# Patient Record
Sex: Male | Born: 1980 | Race: White | Hispanic: No | Marital: Married | Smoking: Never smoker
Health system: Southern US, Community
[De-identification: ages and names within clinical notes are randomized; demographics above are authoritative.]

## PROBLEM LIST (undated history)

## (undated) DIAGNOSIS — I10 Essential (primary) hypertension: Secondary | ICD-10-CM

## (undated) HISTORY — PX: ORTHOPEDIC SURGERY: SHX850

## (undated) HISTORY — DX: Essential (primary) hypertension: I10

---

## 2000-09-23 ENCOUNTER — Encounter: Payer: Self-pay | Admitting: General Surgery

## 2000-09-23 ENCOUNTER — Inpatient Hospital Stay (HOSPITAL_COMMUNITY): Admission: AC | Admit: 2000-09-23 | Discharge: 2000-10-05 | Payer: Self-pay

## 2000-09-23 ENCOUNTER — Encounter: Payer: Self-pay | Admitting: Orthopedic Surgery

## 2000-09-24 ENCOUNTER — Encounter: Payer: Self-pay | Admitting: Surgery

## 2000-10-05 ENCOUNTER — Inpatient Hospital Stay (HOSPITAL_COMMUNITY)
Admission: RE | Admit: 2000-10-05 | Discharge: 2000-10-14 | Payer: Self-pay | Admitting: Physical Medicine & Rehabilitation

## 2000-11-03 ENCOUNTER — Emergency Department (HOSPITAL_COMMUNITY): Admission: EM | Admit: 2000-11-03 | Discharge: 2000-11-04 | Payer: Self-pay

## 2000-11-04 ENCOUNTER — Encounter: Payer: Self-pay | Admitting: Emergency Medicine

## 2000-11-25 ENCOUNTER — Encounter: Payer: Self-pay | Admitting: Orthopedic Surgery

## 2000-11-26 ENCOUNTER — Inpatient Hospital Stay (HOSPITAL_COMMUNITY): Admission: RE | Admit: 2000-11-26 | Discharge: 2000-11-27 | Payer: Self-pay | Admitting: Orthopedic Surgery

## 2000-12-29 ENCOUNTER — Inpatient Hospital Stay (HOSPITAL_COMMUNITY): Admission: RE | Admit: 2000-12-29 | Discharge: 2001-01-01 | Payer: Self-pay | Admitting: Orthopedic Surgery

## 2000-12-29 ENCOUNTER — Encounter: Payer: Self-pay | Admitting: Orthopedic Surgery

## 2001-02-03 ENCOUNTER — Encounter: Payer: Self-pay | Admitting: Orthopedic Surgery

## 2001-02-04 ENCOUNTER — Inpatient Hospital Stay (HOSPITAL_COMMUNITY): Admission: AD | Admit: 2001-02-04 | Discharge: 2001-02-07 | Payer: Self-pay | Admitting: Orthopedic Surgery

## 2001-04-23 ENCOUNTER — Encounter: Payer: Self-pay | Admitting: Orthopedic Surgery

## 2001-04-24 ENCOUNTER — Inpatient Hospital Stay (HOSPITAL_COMMUNITY): Admission: RE | Admit: 2001-04-24 | Discharge: 2001-04-26 | Payer: Self-pay | Admitting: Orthopedic Surgery

## 2001-10-06 ENCOUNTER — Encounter: Payer: Self-pay | Admitting: Orthopedic Surgery

## 2001-10-06 ENCOUNTER — Inpatient Hospital Stay (HOSPITAL_COMMUNITY): Admission: RE | Admit: 2001-10-06 | Discharge: 2001-10-09 | Payer: Self-pay | Admitting: Orthopedic Surgery

## 2002-01-30 ENCOUNTER — Encounter: Payer: Self-pay | Admitting: Emergency Medicine

## 2002-01-30 ENCOUNTER — Emergency Department (HOSPITAL_COMMUNITY): Admission: EM | Admit: 2002-01-30 | Discharge: 2002-01-30 | Payer: Self-pay | Admitting: Emergency Medicine

## 2002-12-22 ENCOUNTER — Ambulatory Visit (HOSPITAL_COMMUNITY): Admission: RE | Admit: 2002-12-22 | Discharge: 2002-12-23 | Payer: Self-pay | Admitting: Orthopedic Surgery

## 2003-11-20 ENCOUNTER — Emergency Department (HOSPITAL_COMMUNITY): Admission: EM | Admit: 2003-11-20 | Discharge: 2003-11-20 | Payer: Self-pay | Admitting: Emergency Medicine

## 2004-09-08 ENCOUNTER — Emergency Department (HOSPITAL_COMMUNITY): Admission: EM | Admit: 2004-09-08 | Discharge: 2004-09-08 | Payer: Self-pay | Admitting: *Deleted

## 2006-07-11 ENCOUNTER — Emergency Department: Payer: Self-pay | Admitting: Emergency Medicine

## 2007-07-24 ENCOUNTER — Emergency Department: Payer: Self-pay | Admitting: Emergency Medicine

## 2009-12-16 ENCOUNTER — Ambulatory Visit: Payer: Self-pay | Admitting: Internal Medicine

## 2009-12-17 ENCOUNTER — Emergency Department: Payer: Self-pay | Admitting: Emergency Medicine

## 2012-04-26 ENCOUNTER — Emergency Department: Payer: Self-pay | Admitting: Emergency Medicine

## 2012-04-26 ENCOUNTER — Other Ambulatory Visit: Payer: Self-pay | Admitting: Physician Assistant

## 2012-04-26 LAB — COMPREHENSIVE METABOLIC PANEL
Albumin: 4.4 g/dL (ref 3.4–5.0)
Alkaline Phosphatase: 91 U/L (ref 50–136)
Anion Gap: 3 — ABNORMAL LOW (ref 7–16)
BUN: 15 mg/dL (ref 7–18)
Bilirubin,Total: 1 mg/dL (ref 0.2–1.0)
Calcium, Total: 8.6 mg/dL (ref 8.5–10.1)
Chloride: 108 mmol/L — ABNORMAL HIGH (ref 98–107)
Co2: 27 mmol/L (ref 21–32)
Creatinine: 0.93 mg/dL (ref 0.60–1.30)
EGFR (African American): >60
EGFR (Non-African Amer.): >60
Glucose: 91 mg/dL (ref 65–99)
Osmolality: 276 (ref 275–301)
Potassium: 4.3 mmol/L (ref 3.5–5.1)
SGOT(AST): 27 U/L (ref 15–37)
SGPT (ALT): 48 U/L (ref 12–78)
Sodium: 138 mmol/L (ref 136–145)
Total Protein: 8.3 g/dL — ABNORMAL HIGH (ref 6.4–8.2)

## 2012-04-26 LAB — CBC
HCT: 46.2 % (ref 40.0–52.0)
HGB: 15.9 g/dL (ref 13.0–18.0)
MCH: 28.9 pg (ref 26.0–34.0)
MCHC: 34.3 g/dL (ref 32.0–36.0)
MCV: 84 fL (ref 80–100)
Platelet: 208 10*3/uL (ref 150–440)
RBC: 5.48 10*6/uL (ref 4.40–5.90)
RDW: 12.9 % (ref 11.5–14.5)
WBC: 12.3 10*3/uL — ABNORMAL HIGH (ref 3.8–10.6)

## 2012-04-26 LAB — TROPONIN I
Troponin-I: <0.02 ng/mL
Troponin-I: <0.02 ng/mL

## 2012-04-26 LAB — CK TOTAL AND CKMB (NOT AT ARMC): CK, Total: 86 U/L (ref 35–232)

## 2012-04-26 LAB — PRO B NATRIURETIC PEPTIDE: B-Type Natriuretic Peptide: 83 pg/mL (ref 0–125)

## 2018-01-24 ENCOUNTER — Ambulatory Visit (INDEPENDENT_AMBULATORY_CARE_PROVIDER_SITE_OTHER): Payer: 59 | Admitting: Family Medicine

## 2018-01-24 ENCOUNTER — Encounter: Payer: Self-pay | Admitting: Family Medicine

## 2018-01-24 VITALS — BP 118/76 | HR 66 | Temp 97.9°F | Resp 16 | Ht 69.5 in | Wt 255.0 lb

## 2018-01-24 DIAGNOSIS — E785 Hyperlipidemia, unspecified: Secondary | ICD-10-CM

## 2018-01-24 DIAGNOSIS — Z114 Encounter for screening for human immunodeficiency virus [HIV]: Secondary | ICD-10-CM

## 2018-01-24 DIAGNOSIS — Z0000 Encounter for general adult medical examination without abnormal findings: Secondary | ICD-10-CM

## 2018-01-24 DIAGNOSIS — Z23 Encounter for immunization: Secondary | ICD-10-CM

## 2018-01-24 DIAGNOSIS — Z842 Family history of other diseases of the genitourinary system: Secondary | ICD-10-CM

## 2018-01-24 DIAGNOSIS — E559 Vitamin D deficiency, unspecified: Secondary | ICD-10-CM

## 2018-01-24 LAB — LIPID PANEL
Cholesterol: 207 mg/dL — ABNORMAL HIGH (ref 0–200)
HDL: 57 mg/dL (ref >39.00)
LDL Cholesterol: 131 mg/dL — ABNORMAL HIGH (ref 0–99)
NonHDL: 150.22
Total CHOL/HDL Ratio: 4
Triglycerides: 97 mg/dL (ref 0.0–149.0)
VLDL: 19.4 mg/dL (ref 0.0–40.0)

## 2018-01-24 LAB — COMPREHENSIVE METABOLIC PANEL
ALT: 37 U/L (ref 0–53)
AST: 22 U/L (ref 0–37)
Albumin: 4.6 g/dL (ref 3.5–5.2)
Alkaline Phosphatase: 52 U/L (ref 39–117)
BUN: 12 mg/dL (ref 6–23)
CO2: 27 meq/L (ref 19–32)
Calcium: 9.3 mg/dL (ref 8.4–10.5)
Chloride: 103 meq/L (ref 96–112)
Creatinine, Ser: 0.85 mg/dL (ref 0.40–1.50)
GFR: 106.3 mL/min (ref >60.00)
Glucose, Bld: 90 mg/dL (ref 70–99)
Potassium: 3.9 meq/L (ref 3.5–5.1)
Sodium: 137 meq/L (ref 135–145)
Total Bilirubin: 0.9 mg/dL (ref 0.2–1.2)
Total Protein: 7.6 g/dL (ref 6.0–8.3)

## 2018-01-24 LAB — VITAMIN D 25 HYDROXY (VIT D DEFICIENCY, FRACTURES): VITD: 21.65 ng/mL — ABNORMAL LOW (ref 30.00–100.00)

## 2018-01-24 LAB — B12 AND FOLATE PANEL
Folate: 15.9 ng/mL
Vitamin B-12: 491 pg/mL (ref 211–911)

## 2018-01-24 LAB — CBC
HCT: 40 % (ref 39.0–52.0)
Hemoglobin: 14.2 g/dL (ref 13.0–17.0)
MCHC: 35.4 g/dL (ref 30.0–36.0)
MCV: 83.9 fl (ref 78.0–100.0)
Platelets: 252 K/uL (ref 150.0–400.0)
RBC: 4.77 Mil/uL (ref 4.22–5.81)
RDW: 13.1 % (ref 11.5–15.5)
WBC: 5.3 K/uL (ref 4.0–10.5)
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
## 2018-01-25 LAB — THYROID PANEL WITH TSH
Free Thyroxine Index: 2.1 (ref 1.4–3.8)
T3 Uptake: 35 % (ref 22–35)
T4, Total: 6.1 ug/dL (ref 4.9–10.5)
TSH: 1.23 m[IU]/L (ref 0.40–4.50)

## 2018-01-25 LAB — PSA, TOTAL AND FREE
PSA, % Free: 20 % — ABNORMAL LOW (ref >25)
PSA, Free: 0.1 ng/mL
PSA, Total: 0.5 ng/mL (ref <=4.0)

## 2018-01-25 LAB — HIV ANTIBODY (ROUTINE TESTING W REFLEX): HIV 1&2 Ab, 4th Generation: NONREACTIVE

## 2018-01-26 NOTE — Addendum Note (Signed)
Addended by: Leanora Cover on: 01/26/2018 02:41 PM   Modules accepted: Orders

## 2018-02-09 ENCOUNTER — Ambulatory Visit (INDEPENDENT_AMBULATORY_CARE_PROVIDER_SITE_OTHER): Payer: 59

## 2018-02-09 ENCOUNTER — Ambulatory Visit (INDEPENDENT_AMBULATORY_CARE_PROVIDER_SITE_OTHER): Payer: 59 | Admitting: Family Medicine

## 2018-02-09 ENCOUNTER — Encounter: Payer: Self-pay | Admitting: Family Medicine

## 2018-02-09 VITALS — BP 112/82 | HR 70 | Temp 98.0°F | Resp 16 | Ht 69.5 in | Wt 263.4 lb

## 2018-02-09 DIAGNOSIS — Z6838 Body mass index (BMI) 38.0-38.9, adult: Secondary | ICD-10-CM | POA: Diagnosis not present

## 2018-02-09 DIAGNOSIS — E785 Hyperlipidemia, unspecified: Secondary | ICD-10-CM | POA: Diagnosis not present

## 2018-02-09 DIAGNOSIS — M25562 Pain in left knee: Secondary | ICD-10-CM

## 2018-02-09 DIAGNOSIS — E66812 Obesity, class 2: Secondary | ICD-10-CM | POA: Insufficient documentation
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
## 2018-02-09 NOTE — Patient Instructions (Signed)
Take aleve 220 mg twice per day OR 400-600 mg of ibuprofen 3 times a day as needed for joint pain   This is  Dr. Melina Schoolsullo's  example of a  "Low GI"  Diet:  It will allow you to lose 4 to 8  lbs  per month if you follow it carefully.  Your goal with exercise is a minimum of 30 minutes of aerobic exercise 5 days per week (Walking does not count once it becomes easy!)     All of the foods can be found at grocery stores and in bulk at [employer].  The Atkins protein bars and shakes are available in more varieties at Target, WalMart and [employer]'s Foods.       7 AM Breakfast:  Choose from the following:             Low carbohydrate Protein  Shakes (I recommend the  Premier Protein chocolate shakes,  EAS AdvantEdge "Carb Control" shakes  Or the Atkins shakes all are under 3 net carbs)                           a scrambled egg/bacon/cheese burrito made with Mission's "carb balance" whole wheat tortilla  (about 10 net carbs )             Medical laboratory scientific officerJimmy Deans sells microwaveable frittata (basically a quiche without the pastry crust) that is eaten cold and very convenient way to get your eggs.  8 carbs)             If you make your own protein shakes, avoid bananas and pineapple,  And use low carb greek yogurt or original /unsweetened almond or soy milk     Avoid cereal and bananas, oatmeal and cream of wheat and grits. They are loaded with carbohydrates!     10 AM: high protein snack:             Protein bar by Atkins (the snack size, under 200 cal, usually < 6 net carbs).               A stick of cheese:  Around 1 carb,  100 cal                Dannon Light n Fit [place] Yogurt  (80 cal, 8 carbs)  Other so called "protein bars" and Greek yogurts tend to be loaded with carbohydrates.  Remember, in food advertising, the word "energy" is synonymous for " carbohydrate."   Lunch:              A Sandwich using the bread choices listed, Can use any  Eggs,  lunchmeat, grilled meat or canned tuna), avocado, regular mayo/mustard   and cheese.             A Salad using blue cheese, ranch,  Goddess or vinagrette,  Avoid taco shells, croutons or "confetti" and no "candied nuts" but regular nuts OK.              No pretzels, nabs  or chips.  Pickles and miniature sweet peppers are a good low carb alternative that provide a "crunch"  The bread is the only source of carbohydrate in a sandwich and  can be decreased by trying some of the attached alternatives to traditional loaf bread              Avoid "Low fat dressings, as well as ActorCatalina and [place] dressings  They are loaded with sugar!     3 PM/ Mid day  Snack:             Consider  1 ounce of  almonds, walnuts, pistachios, pecans, peanuts,  Macadamia nuts or a nut medley.  Avoid "granola and granola bars "  Mixed nuts are ok in moderation as long as there are no raisins,  cranberries or dried fruit.   KIND bars are OK if you get the low glycemic index variety   Try the prosciutto/mozzarella cheese sticks by Fiorruci  In deli /backery section   High protein                 6 PM  Dinner:                Meat/fowl/fish with a green salad, and either broccoli, cauliflower, green beans, spinach, brussel sprouts or  Lima beans. DO NOT BREAD THE PROTEIN!!               There is a low carb pasta by Dreamfield's that is acceptable and tastes great: only 5 digestible carbs/serving.( All grocery stores but BJs carry it ) Several ready made meals are available low carb:              Try Michel Angelo's chicken piccata or chicken or eggplant parm over low carb pasta.(Lowes and BJs)              Clifton Custard Sanchez's "Carnitas" (pulled pork, no sauce,  0 carbs) or his beef pot roast to make a dinner burrito (at [employer]J's)             Pesto over low carb pasta (bj's sells a good quality pesto in the center refrigerated section of the deli              Try satueeing  Roosvelt Harps with mushroooms as a good side              Green Giant makes a mashed cauliflower that tastes like mashed  potatoes   Whole wheat pasta is still full of digestible carbs and  Not as low in glycemic index as Dreamfield's.   Brown rice is still rice,  So skip the rice and noodles if you eat [place] or [place] (or at least limit to 1/2 cup)   9 PM snack :              Breyer's "low carb" fudgsicle or  ice cream bar (Carb Smart line), or  Weight Watcher's ice cream bar , or another "no sugar added" ice cream;             a serving of fresh berries/cherries with whipped cream              Cheese or DANNON'S LlGHT N FIT GREEK YOGURT             8 ounces of [place] unsweetened almond/cococunut milk               Treat yourself to a parfait made with whipped cream blueberiies, walnuts and vanilla greek yogurt   Avoid bananas, pineapple, grapes  and watermelon on a regular basis because they are high in sugar.  THINK OF THEM AS DESSERT   Remember that snack Substitutions should be less than 10 NET carbs per serving and meals < 20 carbs. Remember to subtract fiber grams to get the "net carbs."

## 2018-02-23 ENCOUNTER — Ambulatory Visit: Payer: 59 | Admitting: Family Medicine

## 2018-03-03 ENCOUNTER — Other Ambulatory Visit: Payer: Self-pay | Admitting: Family Medicine

## 2018-03-03 ENCOUNTER — Telehealth: Payer: Self-pay

## 2018-03-03 DIAGNOSIS — M25562 Pain in left knee: Secondary | ICD-10-CM

## 2018-03-03 MED ORDER — MELOXICAM 7.5 MG PO TABS: 7.5000 mg | ORAL_TABLET | Freq: Every day | ORAL | 1 refills | Status: DC | PRN

## 2018-03-03 NOTE — Telephone Encounter (Signed)
I can do a once daily mobic 7.5mg  for pain, no additional advil with this. Can take tylenol in addition if needed.   If he is still having pain and cannot afford physical therapy we should refer to sports medicine or orthopedics

## 2018-03-03 NOTE — Telephone Encounter (Signed)
Please find out pharmacy.   None in chart

## 2018-03-03 NOTE — Telephone Encounter (Signed)
[facility] on [address], in  [place]. I just added it.

## 2018-03-03 NOTE — Telephone Encounter (Signed)
Pt called [facility] Pt saw Lauren on 02/09/2018 for knee pain and he call today to say he still having the pain and wanted something called in for pain. I told him since it has been almost a month since he was seen for the pain that he needed an appt and I did schedule him for  03/04/2018 and he asked the [facility] Shames give him a call

## 2018-03-03 NOTE — Telephone Encounter (Signed)
Copied from CRM [id number]. Topic: General - Other >> Mar 03, 2018  8:35 AM Percival Spanish wrote:  Pt saw Lauren on 02/09/2018 for knee pain and he call today to say he still having the pain and wanted something called in for pain. I told him since it has been almost a month since he was seen for the pain that he needed an appt and I did schedule him for  03/04/2018 and he asked the Leotis Shames give him a call

## 2018-03-04 ENCOUNTER — Ambulatory Visit: Payer: 59 | Admitting: Family Medicine

## 2018-03-24 ENCOUNTER — Ambulatory Visit: Payer: 59 | Admitting: Family Medicine

## 2018-04-11 ENCOUNTER — Ambulatory Visit: Payer: 59 | Admitting: Family Medicine

## 2018-04-27 ENCOUNTER — Other Ambulatory Visit: Payer: 59

## 2018-04-29 ENCOUNTER — Ambulatory Visit: Payer: 59 | Admitting: Family Medicine

## 2018-04-29 ENCOUNTER — Ambulatory Visit (INDEPENDENT_AMBULATORY_CARE_PROVIDER_SITE_OTHER): Payer: 59 | Admitting: Family Medicine

## 2018-04-29 ENCOUNTER — Other Ambulatory Visit: Payer: Self-pay

## 2018-04-29 ENCOUNTER — Encounter: Payer: Self-pay | Admitting: Family Medicine

## 2018-04-29 DIAGNOSIS — E559 Vitamin D deficiency, unspecified: Secondary | ICD-10-CM

## 2018-04-29 DIAGNOSIS — M25562 Pain in left knee: Secondary | ICD-10-CM | POA: Diagnosis not present

## 2018-04-29 DIAGNOSIS — Z713 Dietary counseling and surveillance: Secondary | ICD-10-CM | POA: Diagnosis not present

## 2018-04-29 DIAGNOSIS — E785 Hyperlipidemia, unspecified: Secondary | ICD-10-CM | POA: Diagnosis not present

## 2018-04-29 DIAGNOSIS — G8929 Other chronic pain: Secondary | ICD-10-CM
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
## 2018-05-02 ENCOUNTER — Other Ambulatory Visit: Payer: Self-pay | Admitting: Family Medicine

## 2018-05-02 DIAGNOSIS — M25562 Pain in left knee: Secondary | ICD-10-CM

## 2018-05-03 ENCOUNTER — Other Ambulatory Visit: Payer: 59

## 2019-01-25 ENCOUNTER — Encounter: Payer: 59 | Admitting: Family Medicine

## 2019-07-26 IMAGING — DX DG KNEE COMPLETE 4+V*L*
5 series · 5 of 5 positions shown · non-contrast
Comparison: 11/20/2003

CLINICAL DATA: Left knee pain

EXAM:
LEFT KNEE - COMPLETE 4+ VIEW

[knee standing ap]
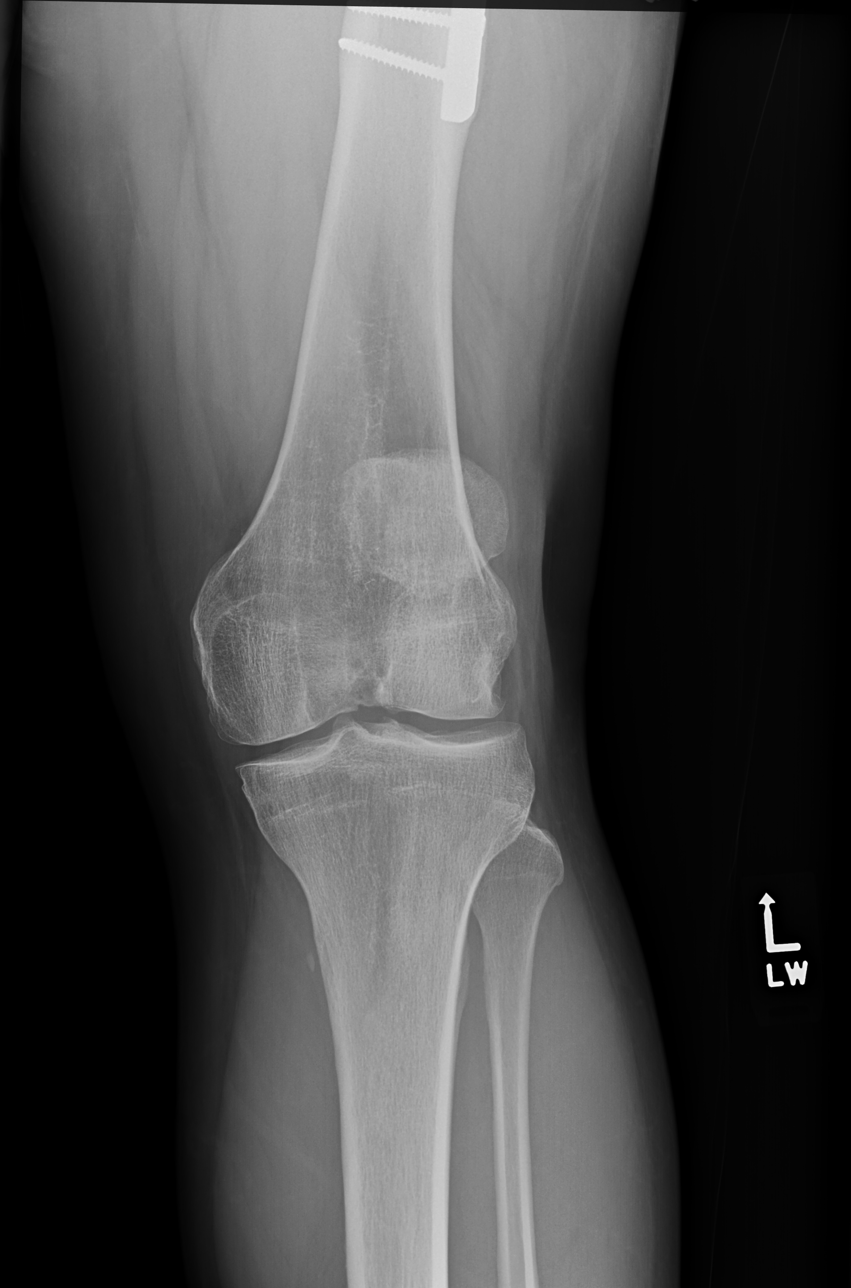

[knee standing external ap]
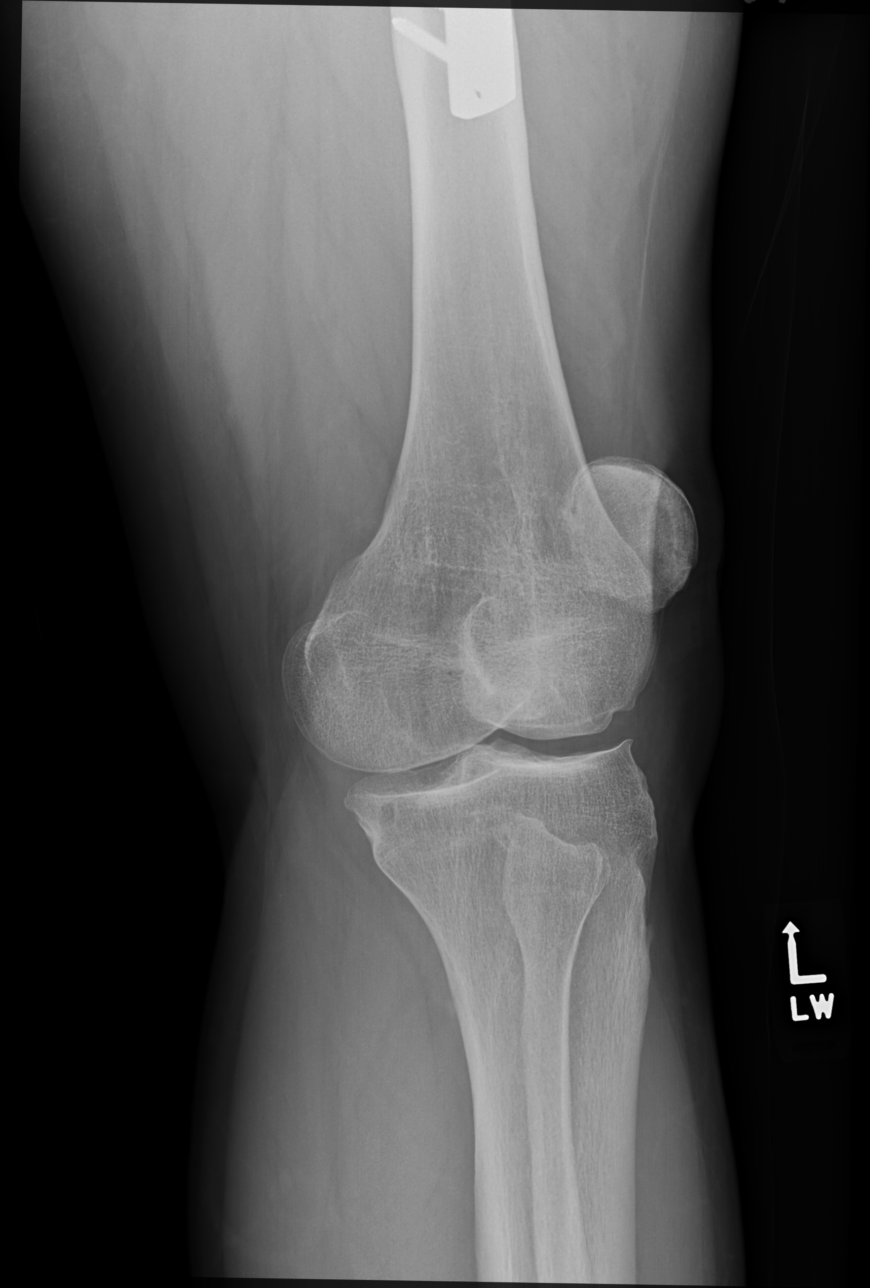

[knee standing internal ap]
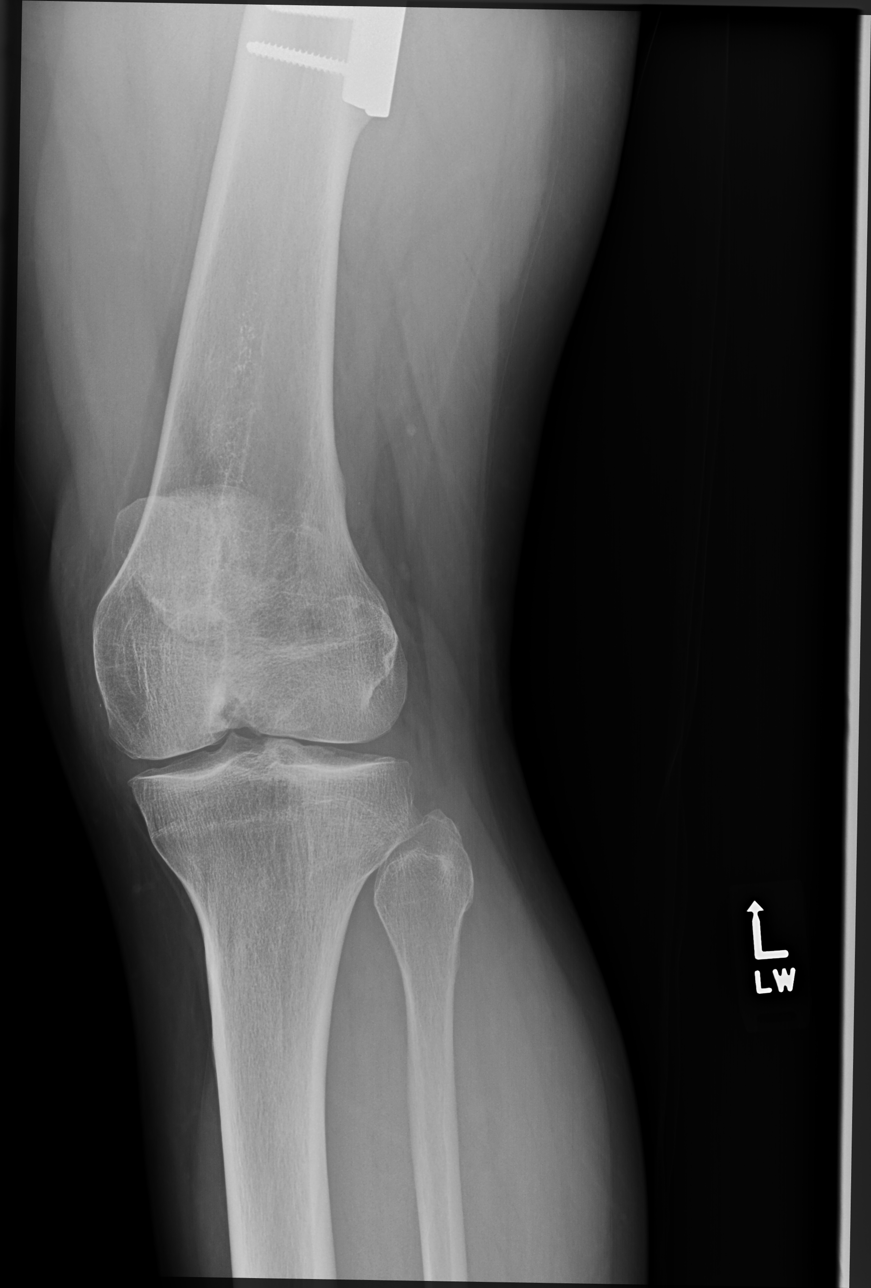

[knee standing lat]
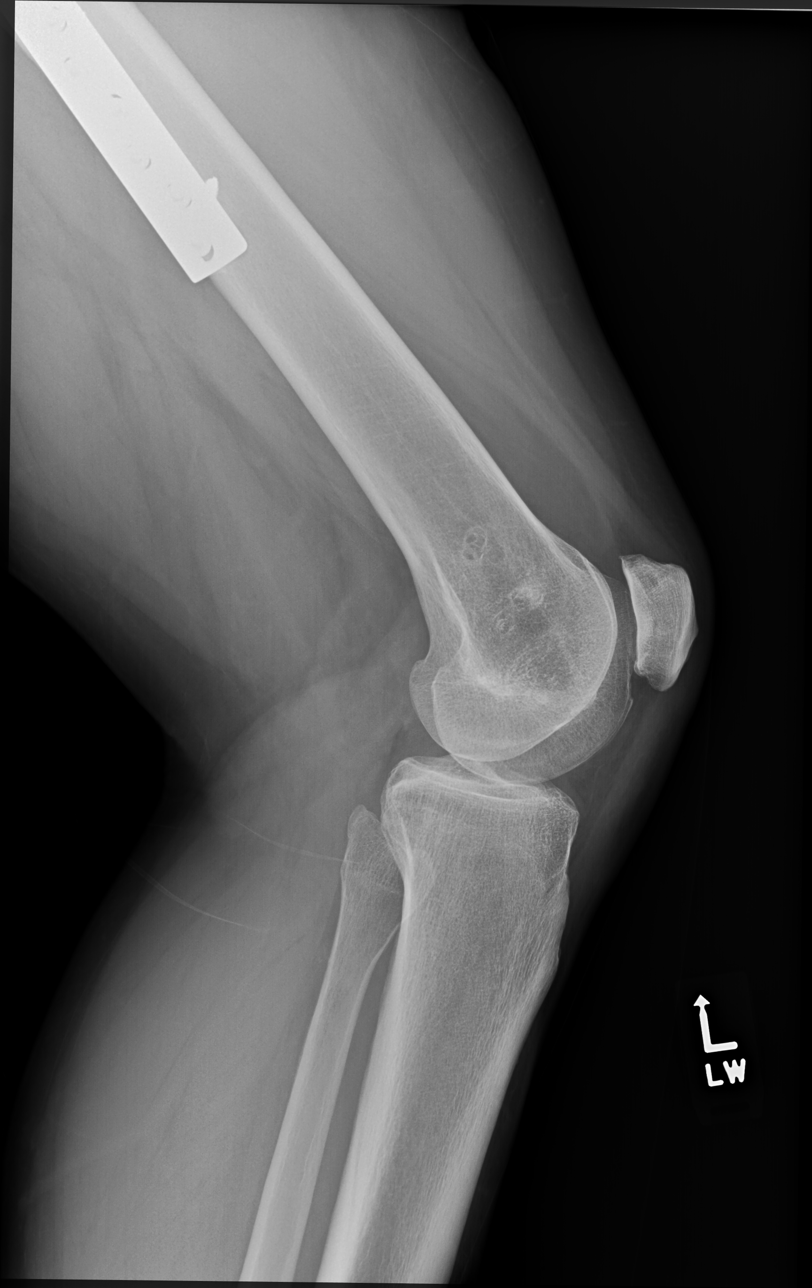

[knee [person_name] view pa]
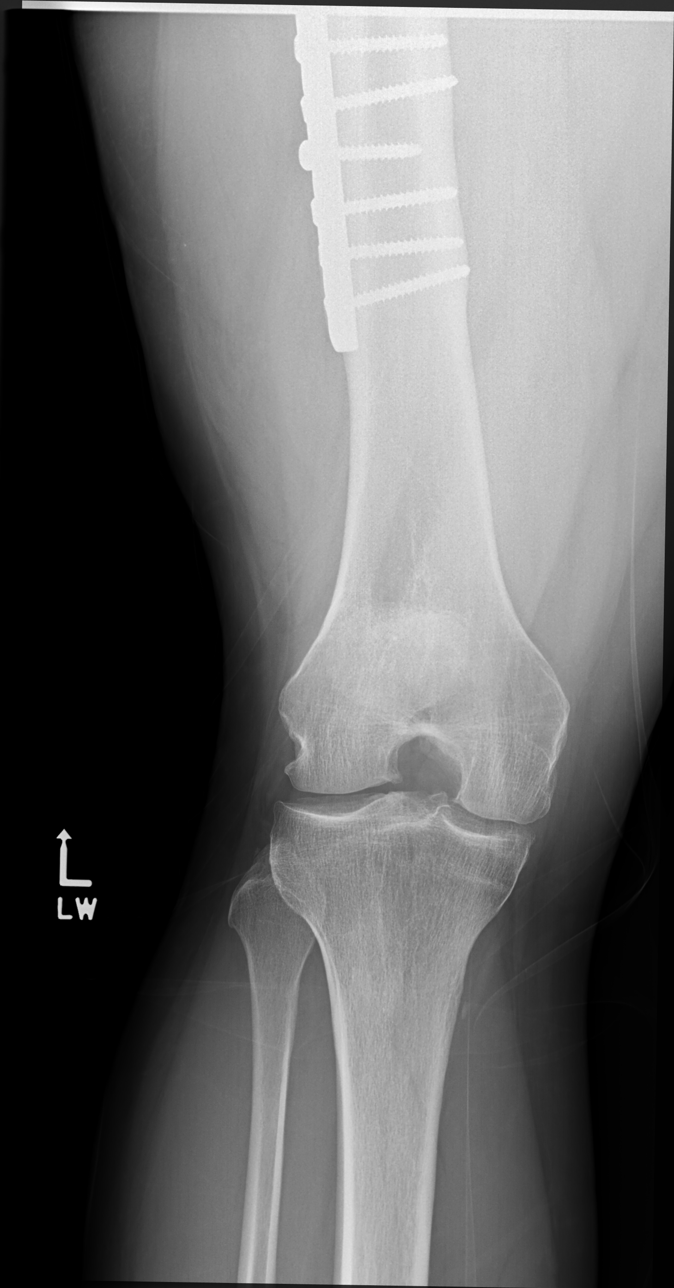

[5 of 5 positions shown; findings below may reference images not displayed]

FINDINGS: The distal aspect of a femoral plate noted in the mid left femur.
Mild tricompartment degenerative changes in the left knee with
spurring and early joint space narrowing. No joint effusion. No
acute bony abnormality. Specifically, no fracture, subluxation, or
dislocation.
IMPRESSION: Early degenerative changes within the left knee. No acute bony
abnormality.

## 2021-05-27 ENCOUNTER — Encounter: Payer: Self-pay | Admitting: Urology

## 2021-05-27 ENCOUNTER — Ambulatory Visit (INDEPENDENT_AMBULATORY_CARE_PROVIDER_SITE_OTHER): Payer: 59 | Admitting: Urology

## 2021-05-27 VITALS — BP 165/97 | HR 76 | Ht 69.5 in | Wt 261.0 lb

## 2021-05-27 DIAGNOSIS — Z3009 Encounter for other general counseling and advice on contraception: Secondary | ICD-10-CM | POA: Diagnosis not present
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
## 2021-05-27 NOTE — Patient Instructions (Signed)

## 2021-05-28 MED ORDER — DIAZEPAM 10 MG PO TABS: 10.0000 mg | ORAL_TABLET | Freq: Once | ORAL | 0 refills | Status: AC

## 2021-06-20 ENCOUNTER — Encounter: Payer: Self-pay | Admitting: Urology

## 2021-06-20 ENCOUNTER — Ambulatory Visit (INDEPENDENT_AMBULATORY_CARE_PROVIDER_SITE_OTHER): Payer: 59 | Admitting: Urology

## 2021-06-20 VITALS — BP 129/80 | HR 80

## 2021-06-20 DIAGNOSIS — Z302 Encounter for sterilization: Secondary | ICD-10-CM

## 2021-06-20 DIAGNOSIS — Z3009 Encounter for other general counseling and advice on contraception: Secondary | ICD-10-CM

## 2021-06-20 NOTE — Progress Notes (Signed)
06/20/2021  CC:  Chief Complaint  Patient presents with   VAS    HPI: Troy Allison is a 41 y.o. male who presents today for a  vasectomy.   He has no history of testicular trauma or pain.  No urinary issues.  No previous scrotal surgeries.   He has 2 children.    Vitals:   06/20/21 0844  BP: 129/80  Pulse: 80   NED. A&Ox3.   No respiratory distress   Abd soft, NT, ND Normal external genitalia with patent urethral meatus   Timeout was performed.  Bilateral Vasectomy Procedure  Pre-Procedure: - Patient's scrotum was prepped and draped for vasectomy. - The vas was palpated through the scrotal skin on the left. - 1% Xylocaine was injected into the skin and surrounding tissue for placement  - In a similar manner, the vas on the right was identified, anesthetized, and stabilized.  Procedure: - A #11 blade was used to make a small stab incision in the skin overlying the vas - The left vas was isolated and brought up through the incision exposing that structure. - Bleeding points were cauterized as they occurred. - The vas was free from the surrounding structures and brought to the view. - A segment was positioned for placement with a hemostat. - A second hemostat was placed and a small segment between the two hemostats and was removed for inspection. - Each end of the transected vas lumen was fulgurated/ obliterated using needlepoint electrocautery -A fascial interposition was performed on testicular end of the vas using #3-0 chromic suture -The same procedure was performed on the right. - A single suture of #3-0 chromic catgut was used to close each lateral scrotal skin incision - A dressing was applied.  Post-Procedure: - Patient was instructed in care of the operative area - A specimen is to be delivered in 12 weeks   -Another form of contraception is to be used until post vasectomy semen analysis  Troy Allison as a scribe for Vanna Scotland, MD.,have  documented all relevant documentation on the behalf of Vanna Scotland, MD,as directed by  Vanna Scotland, MD while in the presence of Vanna Scotland, MD.  I have reviewed the above documentation for accuracy and completeness, and I agree with the above.   Vanna Scotland, MD

## 2021-06-20 NOTE — Patient Instructions (Signed)

## 2021-09-22 ENCOUNTER — Other Ambulatory Visit: Payer: 59

## 2021-09-22 DIAGNOSIS — Z3009 Encounter for other general counseling and advice on contraception: Secondary | ICD-10-CM

## 2021-09-23 LAB — POST-VAS SPERM EVALUATION,QUAL: Volume: 0.8 mL

## 2023-02-08 ENCOUNTER — Other Ambulatory Visit: Payer: Self-pay | Admitting: Internal Medicine

## 2023-02-08 DIAGNOSIS — R7989 Other specified abnormal findings of blood chemistry: Secondary | ICD-10-CM

## 2023-02-25 ENCOUNTER — Ambulatory Visit
Admission: RE | Admit: 2023-02-25 | Discharge: 2023-02-25 | Disposition: A | Discharge status: Home / Self Care | Payer: 59 | Source: Ambulatory Visit | Attending: Internal Medicine | Admitting: Internal Medicine

## 2023-02-25 DIAGNOSIS — R7989 Other specified abnormal findings of blood chemistry: Secondary | ICD-10-CM | POA: Insufficient documentation
